# Patient Record
Sex: Male | Born: 2000 | Race: Black or African American | Marital: Single | Smoking: Never smoker
Health system: Southern US, Community
[De-identification: ages and names within clinical notes are randomized; demographics above are authoritative.]

---
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
## 2019-08-25 ENCOUNTER — Emergency Department (HOSPITAL_COMMUNITY): Payer: Medicaid Other

## 2019-08-25 ENCOUNTER — Encounter (HOSPITAL_COMMUNITY): Payer: Self-pay | Admitting: Emergency Medicine

## 2019-08-25 ENCOUNTER — Emergency Department (HOSPITAL_COMMUNITY)
Admission: EM | Admit: 2019-08-25 | Discharge: 2019-08-25 | Disposition: A | Discharge status: Home / Self Care | Payer: Medicaid Other | Attending: Emergency Medicine | Admitting: Emergency Medicine

## 2019-08-25 ENCOUNTER — Other Ambulatory Visit: Payer: Self-pay

## 2019-08-25 DIAGNOSIS — S61411A Laceration without foreign body of right hand, initial encounter: Secondary | ICD-10-CM

## 2019-08-25 DIAGNOSIS — S66921A Laceration of unspecified muscle, fascia and tendon at wrist and hand level, right hand, initial encounter: Secondary | ICD-10-CM | POA: Insufficient documentation

## 2019-08-25 DIAGNOSIS — Y929 Unspecified place or not applicable: Secondary | ICD-10-CM | POA: Insufficient documentation

## 2019-08-25 DIAGNOSIS — Y999 Unspecified external cause status: Secondary | ICD-10-CM | POA: Insufficient documentation

## 2019-08-25 DIAGNOSIS — Y939 Activity, unspecified: Secondary | ICD-10-CM | POA: Diagnosis not present

## 2019-08-25 DIAGNOSIS — W25XXXA Contact with sharp glass, initial encounter: Secondary | ICD-10-CM | POA: Diagnosis not present

## 2019-08-25 MED ORDER — OXYCODONE-ACETAMINOPHEN 5-325 MG PO TABS
1.0000 | ORAL_TABLET | Freq: Once | ORAL | Status: AC
  Administered 2019-08-25: 1 via ORAL
  Filled 2019-08-25: qty 1

## 2019-08-25 MED ORDER — MORPHINE SULFATE (PF) 4 MG/ML IV SOLN: 4.0000 mg | Freq: Once | INTRAVENOUS | Status: DC

## 2019-08-25 MED ORDER — NAPROXEN 375 MG PO TABS
375.0000 mg | ORAL_TABLET | Freq: Two times a day (BID) | ORAL | 0 refills | Status: AC
Start: 2019-08-25 — End: 2019-09-01

## 2019-08-25 MED ORDER — LIDOCAINE HCL (PF) 1 % IJ SOLN
5.0000 mL | Freq: Once | INTRAMUSCULAR | Status: DC
  Filled 2019-08-25: qty 5

## 2019-08-25 MED ORDER — LIDOCAINE HCL (PF) 1 % IJ SOLN
30.0000 mL | Freq: Once | INTRAMUSCULAR | Status: AC
  Administered 2019-08-25: 30 mL
  Filled 2019-08-25: qty 30

## 2019-08-25 NOTE — Progress Notes (Signed)
Orthopedic Tech Progress Note Patient Details:  Christian [name] August 11, 2000 [id number]  Ortho Devices Type of Ortho Device: Thumb spica splint Splint Material: Fiberglass Ortho Device/Splint Location: URE Ortho Device/Splint Interventions: Application, Ordered   Post Interventions Patient Tolerated: Well Instructions Provided: Care of device   [name] [name] 08/25/2019, 5:00 PM
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
## 2019-08-25 NOTE — Discharge Instructions (Addendum)
I have placed 12 stitches to your right hand, you will need to have these removed within the next 7 to 10 days.  The number to Dr. Izora Ribas is attached to your chart, you will need to schedule an appointment in order to further repair your right thumb.  Please keep your hand elevated while at home, please keep splint in place until follow-up with hand specialist.

## 2019-08-25 NOTE — ED Notes (Signed)
Patient verbalizes understanding of discharge instructions.Patient hand wrapped with non adhesive pads and coban. Ortho applied thumb spica. Opportunity for questioning and answers were provided. Armband removed by staff, pt discharged from ED.
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
## 2019-08-27 ENCOUNTER — Emergency Department (HOSPITAL_BASED_OUTPATIENT_CLINIC_OR_DEPARTMENT_OTHER)
Admission: EM | Admit: 2019-08-27 | Discharge: 2019-08-27 | Disposition: A | Discharge status: Home / Self Care | Payer: Medicaid Other

## 2019-08-27 ENCOUNTER — Other Ambulatory Visit: Payer: Self-pay
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
